# Patient Record
Sex: Male | Born: 2000 | Race: White | Hispanic: No | Marital: Single | Smoking: Never smoker
Health system: Southern US, Community
[De-identification: ages and names within clinical notes are randomized; demographics above are authoritative.]

## PROBLEM LIST (undated history)

## (undated) HISTORY — PX: SHOULDER SURGERY: SHX246

---
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
## 2018-09-10 IMAGING — CR DG CHEST 2V
1 series · 2 of 2 positions shown · non-contrast
Comparison: None.

CLINICAL DATA: Fever last night.

EXAM:
CHEST  2 VIEW

[Series 1: dg chest 2 view · 0.14mm/px · 2 of 2 slices shown]
[im 1/2]
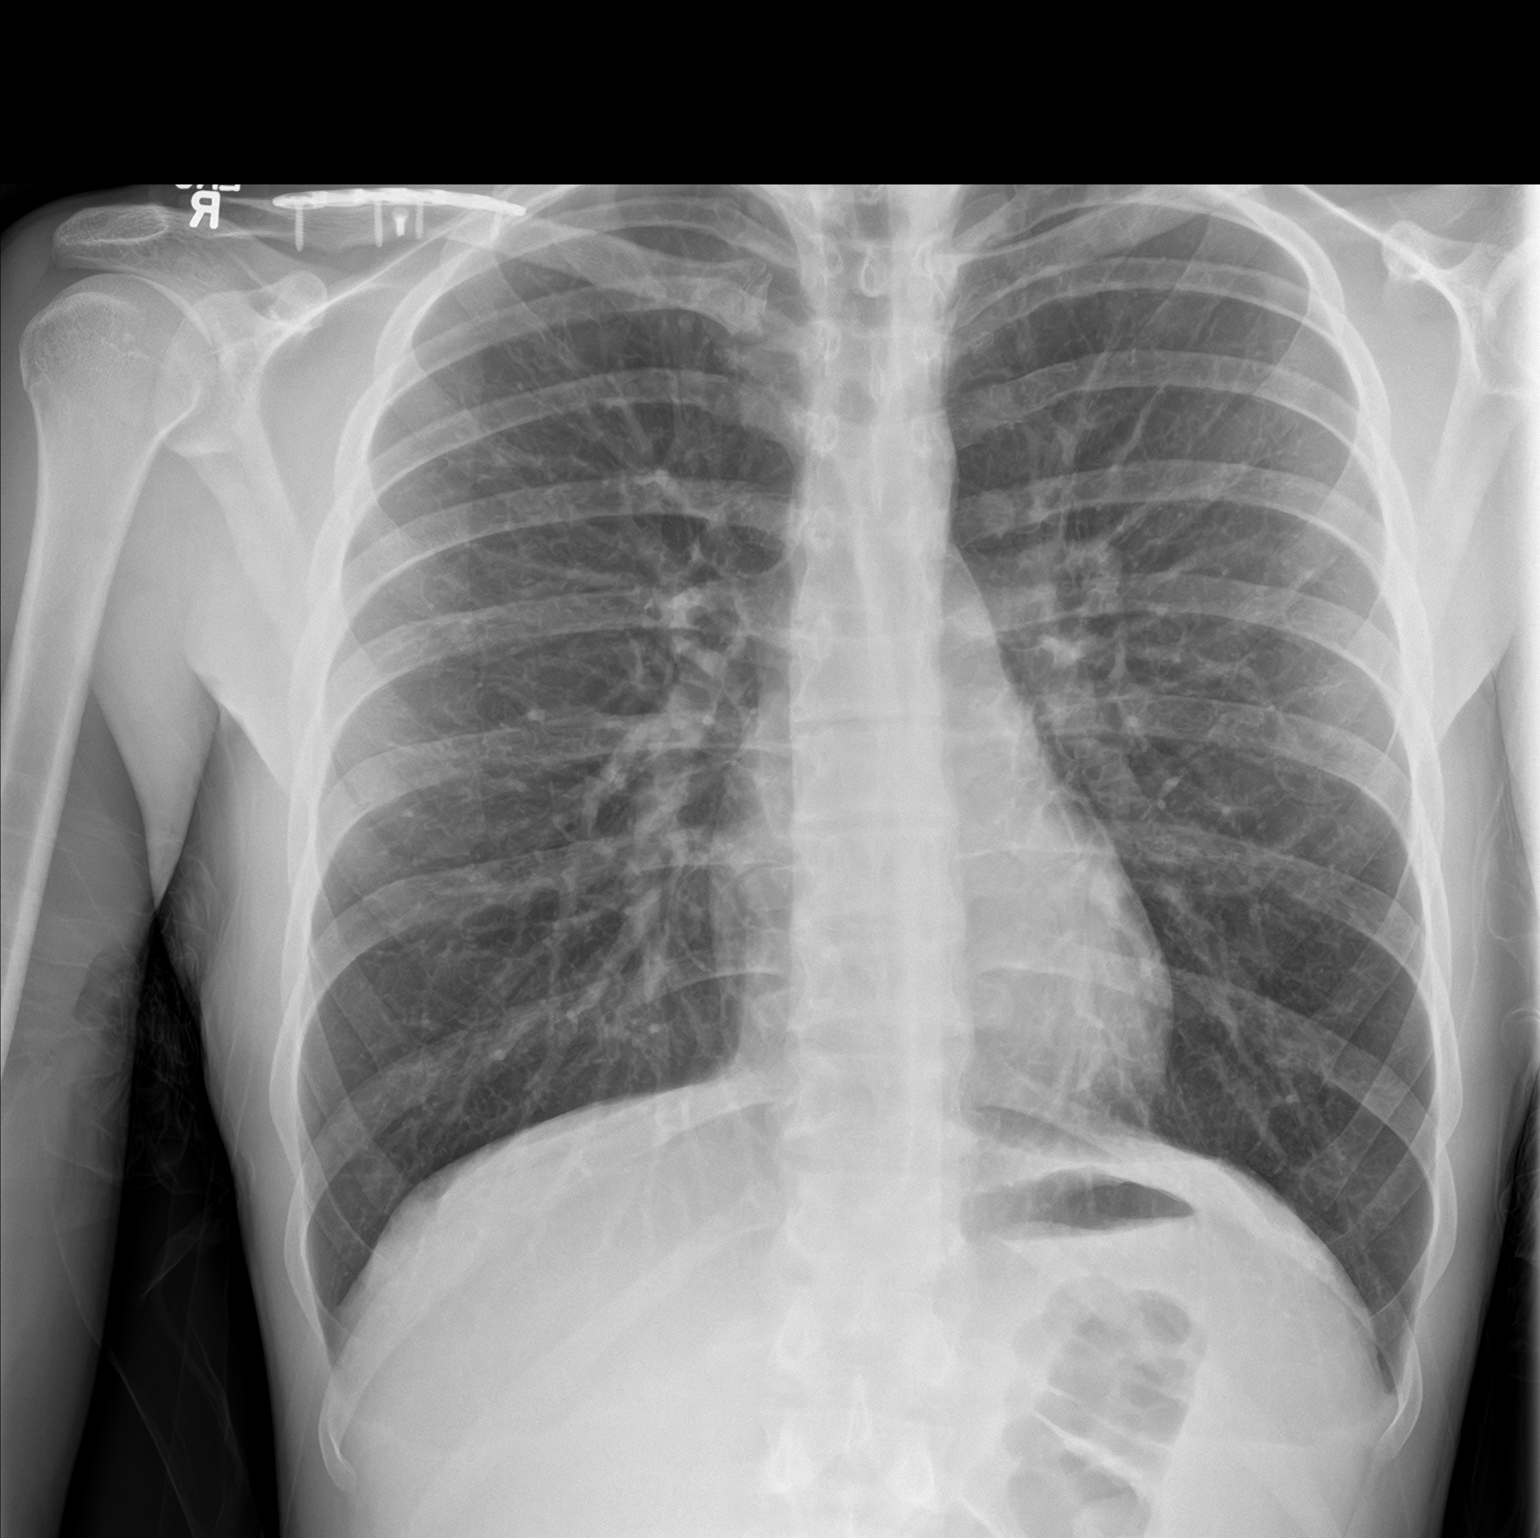
[im 2/2]
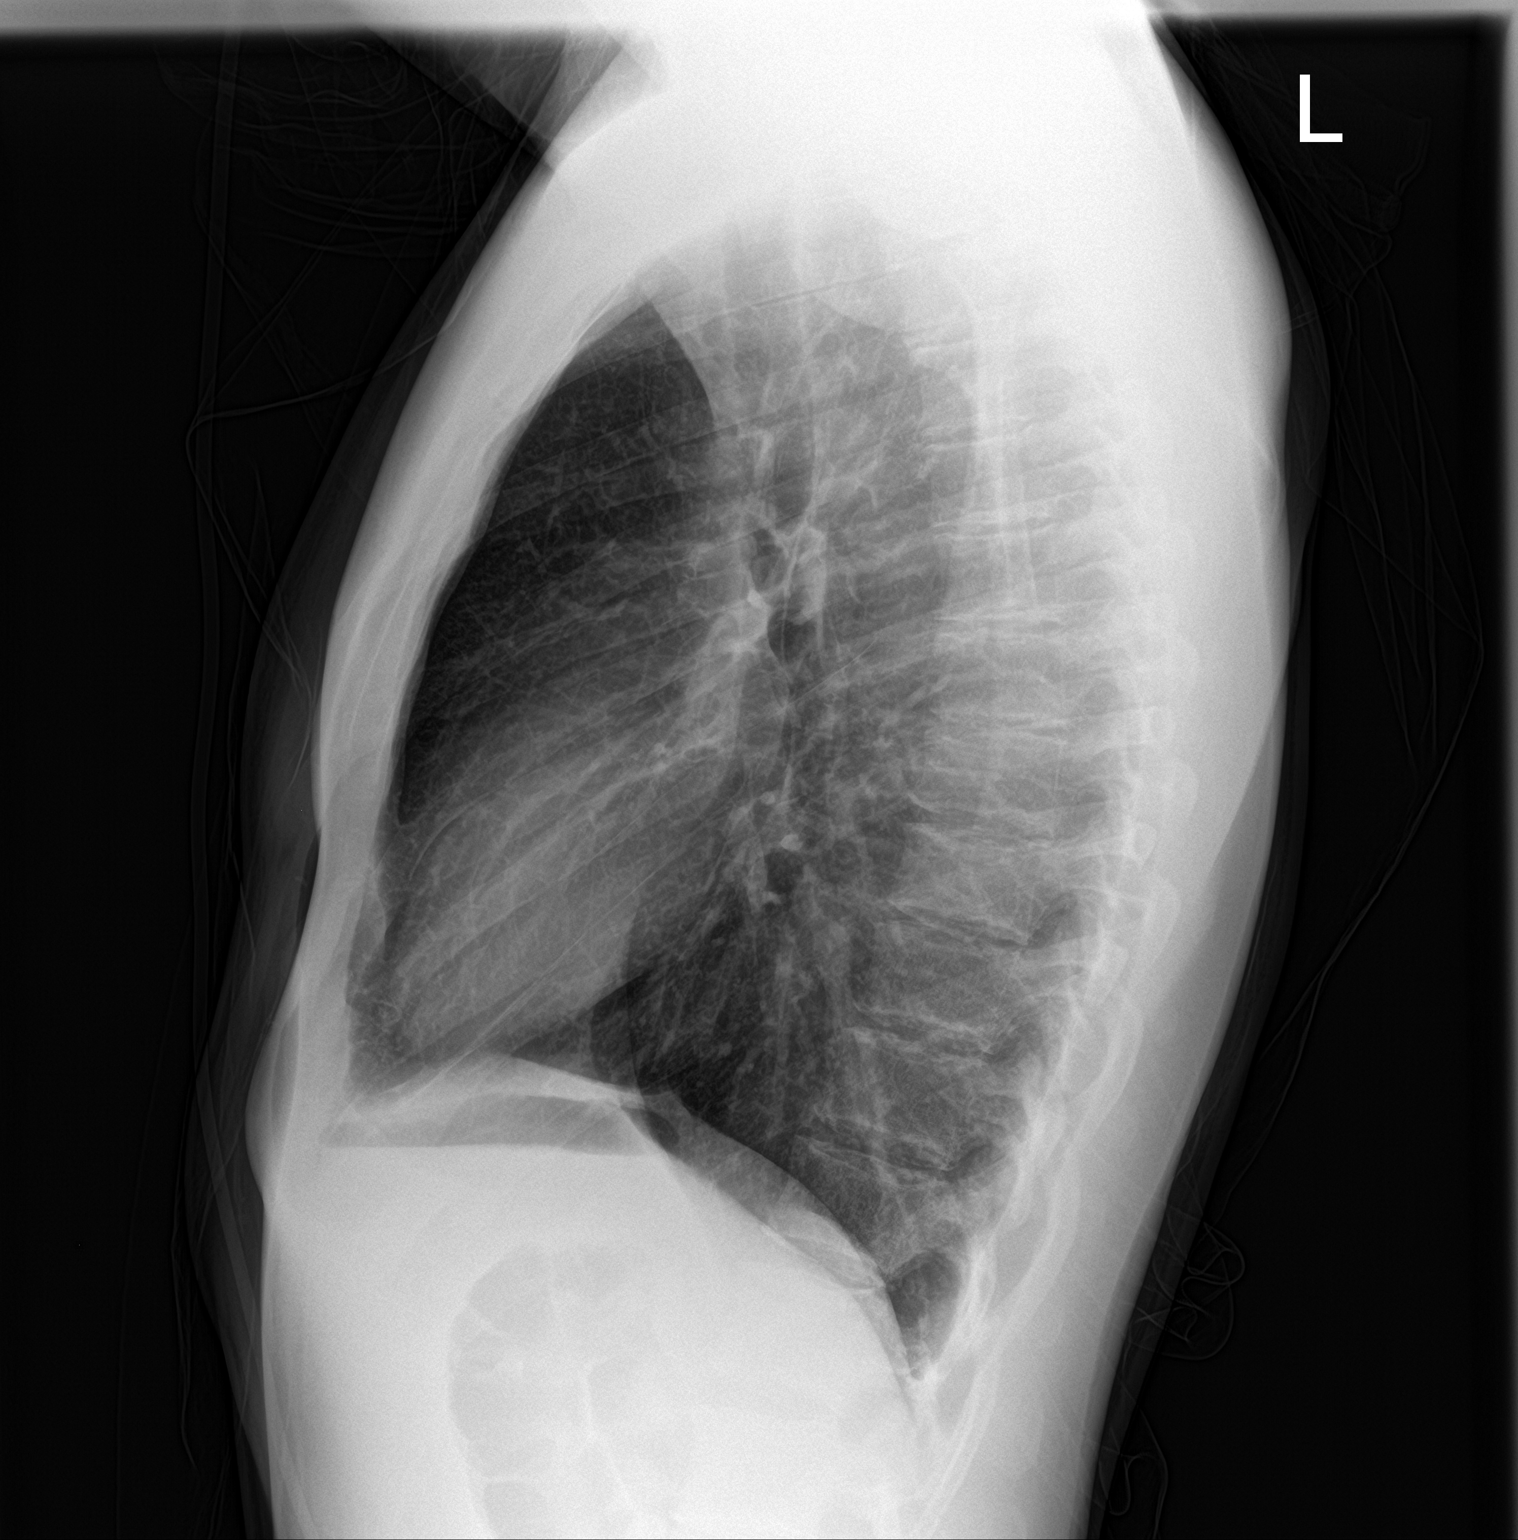

[2 of 2 positions shown; findings below may reference images not displayed]

FINDINGS: The cardiomediastinal contours are normal. Mild bronchial
thickening. Pulmonary vasculature is normal. No consolidation,
pleural effusion, or pneumothorax. No acute osseous abnormalities
are seen. Plate and screw fixation of the right clavicle.
IMPRESSION: Mild bronchial thickening.  No pneumonia.

## 2019-05-04 ENCOUNTER — Emergency Department
Admission: EM | Admit: 2019-05-04 | Discharge: 2019-05-04 | Disposition: A | Discharge status: Home / Self Care | Payer: Managed Care, Other (non HMO) | Attending: Emergency Medicine | Admitting: Emergency Medicine

## 2019-05-04 ENCOUNTER — Other Ambulatory Visit: Payer: Self-pay

## 2019-05-04 DIAGNOSIS — L03113 Cellulitis of right upper limb: Secondary | ICD-10-CM | POA: Insufficient documentation

## 2019-05-04 DIAGNOSIS — R001 Bradycardia, unspecified: Secondary | ICD-10-CM | POA: Diagnosis not present

## 2019-05-04 DIAGNOSIS — Y999 Unspecified external cause status: Secondary | ICD-10-CM | POA: Diagnosis not present

## 2019-05-04 DIAGNOSIS — W57XXXA Bitten or stung by nonvenomous insect and other nonvenomous arthropods, initial encounter: Secondary | ICD-10-CM | POA: Diagnosis not present

## 2019-05-04 DIAGNOSIS — Y9389 Activity, other specified: Secondary | ICD-10-CM | POA: Diagnosis not present

## 2019-05-04 DIAGNOSIS — Y9289 Other specified places as the place of occurrence of the external cause: Secondary | ICD-10-CM | POA: Insufficient documentation

## 2019-05-04 DIAGNOSIS — S40861A Insect bite (nonvenomous) of right upper arm, initial encounter: Secondary | ICD-10-CM | POA: Diagnosis present

## 2019-05-04 MED ORDER — MUPIROCIN CALCIUM 2 % EX CREA: TOPICAL_CREAM | CUTANEOUS | 0 refills | Status: AC

## 2019-05-04 NOTE — ED Notes (Signed)
Pt with bug bite to right upper arm. Penny sized bite mark with redness extending 6 cm around bite. Pt did not see bug that bit him. Pt states it is itchy

## 2019-05-04 NOTE — Discharge Instructions (Addendum)
Happy Early Rudene Anda from [facility]! Apply Bactroban twice daily for 7 days.

## 2019-05-04 NOTE — ED Triage Notes (Addendum)
Reports noticed rash today from bug bite yesterday.  Reports history of lymes disease.

## 2019-05-04 NOTE — ED Provider Notes (Signed)
[facility] Emergency Department Provider Note  ____________________________________________  Time seen: Approximately 8:53 PM  I have reviewed the triage vital signs and the nursing notes.   HISTORY  Chief Complaint Rash    HPI Cesar Mcdonald is a 18 y.o. male presents to the emergency department with an insect bite/sting that occurred yesterday after patient was sitting outside.  Patient noticed surrounding erythema this afternoon and became concerned.  He has been scratching at affected area.  Patient denies knowledge of tick bite but states that he has a history of Lyme disease and wanted to make sure he did not need to be treated with doxycycline again.  He has been afebrile at home.  No other alleviating measures have been attempted.        No past medical history on file.  There are no active problems to display for this patient.   Past Surgical History:  Procedure Laterality Date  . SHOULDER SURGERY      Prior to Admission medications   Medication Sig Start Date End Date Taking? Authorizing Provider  mupirocin cream (BACTROBAN) 2 % Apply to affected area 3 times daily 05/04/19 05/03/20  Lannie Fields, PA-C  predniSONE (DELTASONE) 50 MG tablet Take one 50 mg tablet once daily for the next 5 days. 11/29/17   Lannie Fields, PA-C    Allergies Shellfish allergy  No family history on file.  Social History Social History   Tobacco Use  . Smoking status: Never Smoker  . Smokeless tobacco: Never Used  Substance Use Topics  . Alcohol use: Yes  . Drug use: No     Review of Systems  Constitutional: No fever/chills Eyes: No visual changes. No discharge ENT: No upper respiratory complaints. Cardiovascular: no chest pain. Respiratory: no cough. No SOB. Gastrointestinal: No abdominal pain.  No nausea, no vomiting.  No diarrhea.  No constipation. Genitourinary: Negative for dysuria. No hematuria Musculoskeletal: Negative for musculoskeletal  pain. Skin: Patient has rash. Neurological: Negative for headaches, focal weakness or numbness.   ____________________________________________   PHYSICAL EXAM:  VITAL SIGNS: ED Triage Vitals  Enc Vitals Group     BP 05/04/19 1953 (!) 144/62     Pulse Rate 05/04/19 1953 (!) 44     Resp 05/04/19 1953 18     Temp 05/04/19 1953 97.7 F (36.5 C)     Temp Source 05/04/19 1953 Oral     SpO2 05/04/19 1953 97 %     Weight 05/04/19 1952 180 lb (81.6 kg)     Height 05/04/19 1952 6' (1.829 m)     Head Circumference --      Peak Flow --      Pain Score 05/04/19 1951 0     Pain Loc --      Pain Edu? --      Excl. in [place]? --      Constitutional: Alert and oriented. Well appearing and in no acute distress. Eyes: Conjunctivae are normal. PERRL. EOMI. Head: Atraumatic. Cardiovascular: Normal rate, regular rhythm. Normal S1 and S2.  Good peripheral circulation. Respiratory: Normal respiratory effort without tachypnea or retractions. Lungs CTAB. Good air entry to the bases with no decreased or absent breath sounds. Musculoskeletal: Full range of motion to all extremities. No gross deformities appreciated. Neurologic:  Normal speech and language. No gross focal neurologic deficits are appreciated.  Skin: Patient has insect bite/sting along right upper arm with approximately 2 cm of surrounding erythema. Psychiatric: Mood and affect are normal. Speech and  behavior are normal. Patient exhibits appropriate insight and judgement.   ____________________________________________   LABS (all labs ordered are listed, but only abnormal results are displayed)  Labs Reviewed - No data to display ____________________________________________  EKG   ____________________________________________  RADIOLOGY  No results found.  ____________________________________________    PROCEDURES  Procedure(s) performed:    Procedures    Medications - No data to  display   ____________________________________________   INITIAL IMPRESSION / ASSESSMENT AND PLAN / ED COURSE  Pertinent labs & imaging results that were available during my care of the patient were reviewed by me and considered in my medical decision making (see chart for details).  Review of the [place] CSRS was performed in accordance of the [facility] prior to dispensing any controlled drugs.         Assessment and plan 18 year old male presents to the emergency department after an insect bite/sting occurred yesterday.  Patient was bradycardic in triage but vital signs were otherwise reassuring.  Patient had an insect bite/sting visualized along right upper arm with 2 cm of surrounding cellulitis.  No palpable induration or fluctuance that would suggest abscess.  Patient was treated with Bactroban.  Return precautions were given.  All patient questions were answered.    ____________________________________________  FINAL CLINICAL IMPRESSION(S) / ED DIAGNOSES  Final diagnoses:  Insect bites and stings      NEW MEDICATIONS STARTED DURING THIS VISIT:  ED Discharge Orders         Ordered    mupirocin cream (BACTROBAN) 2 %     05/04/19 2050              This chart was dictated using voice recognition software/Dragon. Despite best efforts to proofread, errors can occur which can change the meaning. Any change was purely unintentional.    Gasper LloydWoods, Jaclyn M, PA-C 05/04/19 2055    Shaune PollackIsaacs, Cameron, MD 05/06/19 [phone]

## 2019-07-04 ENCOUNTER — Emergency Department
Admission: EM | Admit: 2019-07-04 | Discharge: 2019-07-04 | Disposition: A | Discharge status: Home / Self Care | Payer: Managed Care, Other (non HMO) | Attending: Emergency Medicine | Admitting: Emergency Medicine

## 2019-07-04 ENCOUNTER — Encounter: Payer: Self-pay | Admitting: Emergency Medicine

## 2019-07-04 ENCOUNTER — Other Ambulatory Visit: Payer: Self-pay

## 2019-07-04 DIAGNOSIS — J029 Acute pharyngitis, unspecified: Secondary | ICD-10-CM | POA: Diagnosis not present

## 2019-07-04 DIAGNOSIS — R21 Rash and other nonspecific skin eruption: Secondary | ICD-10-CM

## 2019-07-04 LAB — CBC WITH DIFFERENTIAL/PLATELET
Abs Immature Granulocytes: 0.05 10*3/uL (ref 0.00–0.07)
Basophils Absolute: 0.1 10*3/uL (ref 0.0–0.1)
Basophils Relative: 0 %
Eosinophils Absolute: 0.5 10*3/uL (ref 0.0–0.5)
Eosinophils Relative: 3 %
HCT: 45.2 % (ref 39.0–52.0)
Hemoglobin: 15.3 g/dL (ref 13.0–17.0)
Immature Granulocytes: 0 %
Lymphocytes Relative: 7 %
Lymphs Abs: 1.1 10*3/uL (ref 0.7–4.0)
MCH: 29.7 pg (ref 26.0–34.0)
MCHC: 33.8 g/dL (ref 30.0–36.0)
MCV: 87.6 fL (ref 80.0–100.0)
Monocytes Absolute: 1.6 10*3/uL — ABNORMAL HIGH (ref 0.1–1.0)
Monocytes Relative: 10 %
Neutro Abs: 12.9 10*3/uL — ABNORMAL HIGH (ref 1.7–7.7)
Neutrophils Relative %: 80 %
Platelets: 174 10*3/uL (ref 150–400)
RBC: 5.16 MIL/uL (ref 4.22–5.81)
RDW: 12.3 % (ref 11.5–15.5)
WBC: 16.3 10*3/uL — ABNORMAL HIGH (ref 4.0–10.5)
nRBC: 0 % (ref 0.0–0.2)

## 2019-07-04 LAB — COMPREHENSIVE METABOLIC PANEL WITH GFR
ALT: 25 U/L (ref 0–44)
AST: 21 U/L (ref 15–41)
Albumin: 4.4 g/dL (ref 3.5–5.0)
Alkaline Phosphatase: 70 U/L (ref 38–126)
Anion gap: 9 (ref 5–15)
BUN: 9 mg/dL (ref 6–20)
CO2: 27 mmol/L (ref 22–32)
Calcium: 9.2 mg/dL (ref 8.9–10.3)
Chloride: 101 mmol/L (ref 98–111)
Creatinine, Ser: 0.92 mg/dL (ref 0.61–1.24)
GFR calc Af Amer: >60 mL/min
GFR calc non Af Amer: >60 mL/min
Glucose, Bld: 126 mg/dL — ABNORMAL HIGH (ref 70–99)
Potassium: 4.1 mmol/L (ref 3.5–5.1)
Sodium: 137 mmol/L (ref 135–145)
Total Bilirubin: 1.3 mg/dL — ABNORMAL HIGH (ref 0.3–1.2)
Total Protein: 7.7 g/dL (ref 6.5–8.1)

## 2019-07-04 LAB — CBC
HCT: 44.7 % (ref 39.0–52.0)
Hemoglobin: 15.4 g/dL (ref 13.0–17.0)
MCH: 29.9 pg (ref 26.0–34.0)
MCHC: 34.5 g/dL (ref 30.0–36.0)
MCV: 86.8 fL (ref 80.0–100.0)
Platelets: 159 10*3/uL (ref 150–400)
RBC: 5.15 MIL/uL (ref 4.22–5.81)
RDW: 12.1 % (ref 11.5–15.5)
WBC: 16 10*3/uL — ABNORMAL HIGH (ref 4.0–10.5)
nRBC: 0 % (ref 0.0–0.2)

## 2019-07-04 LAB — GROUP A STREP BY PCR: Group A Strep by PCR: NOT DETECTED

## 2019-07-04 LAB — MONONUCLEOSIS SCREEN: Mono Screen: NEGATIVE

## 2019-07-04 MED ORDER — FLUTICASONE PROPIONATE 50 MCG/ACT NA SUSP: 2.0000 | Freq: Every day | NASAL | 0 refills | Status: AC

## 2019-07-04 MED ORDER — AMOXICILLIN 500 MG PO CAPS: 500.0000 mg | ORAL_CAPSULE | Freq: Three times a day (TID) | ORAL | 0 refills | Status: AC

## 2019-07-04 MED ORDER — ACETAMINOPHEN 500 MG PO TABS
1000.0000 mg | ORAL_TABLET | Freq: Once | ORAL | Status: AC
  Administered 2019-07-04: 1000 mg via ORAL
  Filled 2019-07-04: qty 2

## 2019-07-04 MED ORDER — AMOXICILLIN 500 MG PO CAPS
500.0000 mg | ORAL_CAPSULE | Freq: Once | ORAL | Status: AC
  Administered 2019-07-04: 500 mg via ORAL
  Filled 2019-07-04: qty 1

## 2019-07-04 NOTE — ED Notes (Signed)
Pt to the desk at this time, states "hey do you know when I'll go back I've been here for like an hour?" Pt noted to have water bottle in his hand, pt previously noted to have been eating. Ambulatory around the lobby without difficulty at this time.

## 2019-07-04 NOTE — Discharge Instructions (Addendum)
Take the antibiotic as directed. Drink plenty of fluids and continue to take OTC Tylenol and Motrin. Follow-up with [facility] or a local urgent care center, as needed. Return for worsening sore throat pain or swelling.

## 2019-07-04 NOTE — ED Notes (Signed)
Lab called to add on mono. States they need a red tube. Will send soon.

## 2019-07-04 NOTE — ED Triage Notes (Signed)
Pt reports sore throat and swelling and a rash all over his body. Pt reports this has been going on for a couple of days and he now has difficulty swallowing.

## 2019-07-04 NOTE — ED Notes (Signed)
Pt c/o stuffy nose, sore throat, swollen tonsils and hives- pt states that he has had pharyngitis last year and that it feels very similar to his

## 2019-07-04 NOTE — ED Provider Notes (Signed)
[facility] Emergency Department Provider Note ____________________________________________  Time seen: 1406  I have reviewed the triage vital signs and the nursing notes.  HISTORY  Chief Complaint  Sore Throat and Rash  HPI Cesar Mcdonald is a 18 y.o. male presents himself to the ED for evaluation of sore throat and fullness.  Patient also describes a generalized rash to his body.  He describes onset of symptoms for the last couple days, and now reports some increased pain and difficulty with swallowing.   He has been taking ibuprofen and Tylenol in the interim with minimal benefit.  He does not endorse any difficulty controlling his secretions, swallowing, or breathing.  Patient reports a similar episode about a year prior, that resolved after antibiotic treatment.  He apparently was seen at a local urgent care and reports a negative rapid strep test yesterday.  History reviewed. No pertinent past medical history.  There are no active problems to display for this patient.   Past Surgical History:  Procedure Laterality Date  . SHOULDER SURGERY      Prior to Admission medications   Medication Sig Start Date End Date Taking? Authorizing Provider  amoxicillin (AMOXIL) 500 MG capsule Take 1 capsule (500 mg total) by mouth 3 (three) times daily. 07/04/19   Clevland Cork, Charlesetta Ivory, PA-C  fluticasone (FLONASE) 50 MCG/ACT nasal spray Place 2 sprays into both nostrils daily. 07/04/19   Sherrill Buikema, Charlesetta Ivory, PA-C  mupirocin cream (BACTROBAN) 2 % Apply to affected area 3 times daily 05/04/19 05/03/20  Orvil Feil, PA-C  predniSONE (DELTASONE) 50 MG tablet Take one 50 mg tablet once daily for the next 5 days. 11/29/17   Orvil Feil, PA-C    Allergies Peanut-containing drug products  No family history on file.  Social History Social History   Tobacco Use  . Smoking status: Never Smoker  . Smokeless tobacco: Never Used  Substance Use Topics  . Alcohol use:  Yes  . Drug use: No    Review of Systems  Constitutional: Positive for fever. Eyes: Negative for visual changes. ENT: Positive for sore throat. Cardiovascular: Negative for chest pain. Respiratory: Negative for shortness of breath. Gastrointestinal: Negative for abdominal pain, vomiting and diarrhea. Genitourinary: Negative for dysuria. Musculoskeletal: Negative for back pain. Skin: Negative for rash. Neurological: Negative for headaches, focal weakness or numbness. ____________________________________________  PHYSICAL EXAM:  VITAL SIGNS: ED Triage Vitals  Enc Vitals Group     BP 07/04/19 1039 (!) 107/93     Pulse Rate 07/04/19 1039 (!) 102     Resp 07/04/19 1039 16     Temp 07/04/19 1039 99 F (37.2 C)     Temp Source 07/04/19 1039 Oral     SpO2 07/04/19 1039 98 %     Weight 07/04/19 1039 175 lb (79.4 kg)     Height 07/04/19 1039 6' (1.829 m)     Head Circumference --      Peak Flow --      Pain Score 07/04/19 1044 9     Pain Loc --      Pain Edu? --      Excl. in GC? --     Constitutional: Alert and oriented. Well appearing and in no distress. Head: Normocephalic and atraumatic. Eyes: Conjunctivae are normal. PERRL. Normal extraocular movements Ears: Canals clear. TMs intact bilaterally. Nose: No congestion/rhinorrhea/epistaxis. Mouth/Throat: Mucous membranes are moist.  Uvula is midline tonsils are enlarged, and exudates are noted bilaterally. Neck: Supple. No thyromegaly. Hematological/Lymphatic/Immunological:  Palpable anterior cervical  lymphadenopathy. Cardiovascular: Normal rate, regular rhythm. Normal distal pulses. Respiratory: Normal respiratory effort. No wheezes/rales/rhonchi. Gastrointestinal: Soft and nontender. No distention.  Normal bowel sounds noted.  No organomegaly, rebound, guarding, or rigidity appreciated. Musculoskeletal: Nontender with normal range of motion in all extremities.  Neurologic:  Normal gait without ataxia. Normal speech and  language. No gross focal neurologic deficits are appreciated. Skin:  Skin is warm, dry and intact.  Patient with generalized maculopapular rash noted from the neck, trunk, and extremities, including the palms.  Psychiatric: Mood and affect are normal. Patient exhibits appropriate insight and judgment. ____________________________________________   LABS (pertinent positives/negatives) Labs Reviewed  CBC - Abnormal; Notable for the following components:      Result Value   WBC 16.0 (*)    All other components within normal limits  COMPREHENSIVE METABOLIC PANEL - Abnormal; Notable for the following components:   Glucose, Bld 126 (*)    Total Bilirubin 1.3 (*)    All other components within normal limits  CBC WITH DIFFERENTIAL/PLATELET - Abnormal; Notable for the following components:   WBC 16.3 (*)    Neutro Abs 12.9 (*)    Monocytes Absolute 1.6 (*)    All other components within normal limits  GROUP A STREP BY PCR  CULTURE, GROUP A STREP ([place])  MONONUCLEOSIS SCREEN  ____________________________________________  PROCEDURES  Tylenol 1000 mg PO Amoxil 500 mg PO Procedures ____________________________________________  INITIAL IMPRESSION / ASSESSMENT AND PLAN / ED COURSE  Patient with ED evaluation of sore throat, fevers, and generalized rash.  Patient was evaluated for possible x-rays etiology, but reports a recent negative rapid strep test.  Today he is found to be slightly febrile, tachycardic, but otherwise without any signs of acute respiratory distress, sepsis, or toxic appearance.  His Monospot test is negative but he does have a elevated white count with a left shift.  He also has a mild monocytosis.  A throat culture is pending at the time of discharge.  Rapid strep test is again negative at this presentation.  Patient is encouraged to follow-up with a primary provider return to the ED for worsening symptoms as discussed.  A prescription for amoxicillin is provided and he  verbalizes understanding of discharge instructions.  Cesar Mcdonald was evaluated in Emergency Department on 07/04/2019 for the symptoms described in the history of present illness. He was evaluated in the context of the global COVID-19 pandemic, which necessitated consideration that the patient might be at risk for infection with the SARS-CoV-2 virus that causes COVID-19. Institutional protocols and algorithms that pertain to the evaluation of patients at risk for COVID-19 are in a state of rapid change based on information released by regulatory bodies including the CDC and federal and state organizations. These policies and algorithms were followed during the patient's care in the ED. ____________________________________________  FINAL CLINICAL IMPRESSION(S) / ED DIAGNOSES  Final diagnoses:  Sore throat  Pharyngitis, unspecified etiology  Rash and nonspecific skin eruption      Carmie End, Dannielle Karvonen, PA-C 07/04/19 1853    Delman Kitten, MD 07/04/19 1947

## 2019-07-07 ENCOUNTER — Other Ambulatory Visit: Payer: Self-pay

## 2019-07-07 ENCOUNTER — Emergency Department
Admission: EM | Admit: 2019-07-07 | Discharge: 2019-07-07 | Disposition: A | Discharge status: Home / Self Care | Payer: Managed Care, Other (non HMO) | Attending: Emergency Medicine | Admitting: Emergency Medicine

## 2019-07-07 ENCOUNTER — Encounter: Payer: Self-pay | Admitting: Emergency Medicine

## 2019-07-07 DIAGNOSIS — T360X5A Adverse effect of penicillins, initial encounter: Secondary | ICD-10-CM | POA: Diagnosis not present

## 2019-07-07 DIAGNOSIS — Z91010 Allergy to peanuts: Secondary | ICD-10-CM | POA: Diagnosis not present

## 2019-07-07 DIAGNOSIS — Z79899 Other long term (current) drug therapy: Secondary | ICD-10-CM | POA: Diagnosis not present

## 2019-07-07 DIAGNOSIS — B09 Unspecified viral infection characterized by skin and mucous membrane lesions: Secondary | ICD-10-CM | POA: Diagnosis not present

## 2019-07-07 DIAGNOSIS — Z20828 Contact with and (suspected) exposure to other viral communicable diseases: Secondary | ICD-10-CM | POA: Diagnosis not present

## 2019-07-07 DIAGNOSIS — R21 Rash and other nonspecific skin eruption: Secondary | ICD-10-CM

## 2019-07-07 LAB — SARS CORONAVIRUS 2 (TAT 6-24 HRS): SARS Coronavirus 2: NEGATIVE

## 2019-07-07 MED ORDER — DEXAMETHASONE SODIUM PHOSPHATE 10 MG/ML IJ SOLN
10.0000 mg | Freq: Once | INTRAMUSCULAR | Status: AC
  Administered 2019-07-07: 10 mg via INTRAMUSCULAR
  Filled 2019-07-07: qty 1

## 2019-07-07 NOTE — ED Provider Notes (Signed)
[facility] Emergency Department Provider Note   ____________________________________________   First MD Initiated Contact with Patient 07/07/19 [phone]     (approximate)  I have reviewed the triage vital signs and the nursing notes.   HISTORY  Chief Complaint Rash    HPI Cesar Mcdonald is a 18 y.o. male who [facility] with a chief complaint of rash.  Began to experience sore throat and myalgias on Tuesday, 11/10. Went to urgent care on 11/12 with reported negative strep test. Noted rash to entire body on the morning of 11/13 associated with mild itchiness. Seen in ED on afternoon of 11/13 and placed on Amoxicillin for pharyngitis.  Reports sore throat has resolved but rash is worse.  Tested positive for COVID-19 in August.  Denies new exposures, foods or over-the-counter medications.  Has taken amoxicillin before without reaction.  Denies fever, headache, neck pain, cough, chest pain, shortness of breath, abdominal pain, nausea, vomiting, dysuria, urethral discharge.  Denies tongue, lip or facial swelling, sloughing of skin or mucous membranes.  Denies tick bite in the past 2 to 3 months.  No sexual intercourse in the last month.       Past medical history None  There are no active problems to display for this patient.   Past Surgical History:  Procedure Laterality Date  . SHOULDER SURGERY      Prior to Admission medications   Medication Sig Start Date End Date Taking? Authorizing Provider  amoxicillin (AMOXIL) 500 MG capsule Take 1 capsule (500 mg total) by mouth 3 (three) times daily. 07/04/19   Menshew, Charlesetta Ivory, PA-C  fluticasone (FLONASE) 50 MCG/ACT nasal spray Place 2 sprays into both nostrils daily. 07/04/19   Menshew, Charlesetta Ivory, PA-C  mupirocin cream (BACTROBAN) 2 % Apply to affected area 3 times daily 05/04/19 05/03/20  Orvil Feil, PA-C  predniSONE (DELTASONE) 50 MG tablet Take one 50 mg tablet once daily  for the next 5 days. 11/29/17   Orvil Feil, PA-C    Allergies Peanut-containing drug products  History reviewed. No pertinent family history.  Social History Social History   Tobacco Use  . Smoking status: Never Smoker  . Smokeless tobacco: Never Used  Substance Use Topics  . Alcohol use: Yes  . Drug use: No    Review of Systems  Constitutional: No fever/chills Eyes: No visual changes. ENT: Positive for sore throat. Cardiovascular: Denies chest pain. Respiratory: Denies shortness of breath. Gastrointestinal: No abdominal pain.  No nausea, no vomiting.  No diarrhea.  No constipation. Genitourinary: Negative for dysuria. Musculoskeletal: Negative for back pain. Skin: Positive for rash. Neurological: Negative for headaches, focal weakness or numbness.   ____________________________________________   PHYSICAL EXAM:  VITAL SIGNS: ED Triage Vitals  Enc Vitals Group     BP 07/07/19 0518 125/71     Pulse Rate 07/07/19 0518 65     Resp 07/07/19 0518 16     Temp 07/07/19 0518 98.5 F (36.9 C)     Temp Source 07/07/19 0518 Oral     SpO2 07/07/19 0518 100 %     Weight 07/07/19 0517 174 lb 13.2 oz (79.3 kg)     Height 07/07/19 0517 6' (1.829 m)     Head Circumference --      Peak Flow --      Pain Score 07/07/19 0516 0     Pain Loc --      Pain Edu? --  Excl. in GC? --     Constitutional: Alert and oriented. Well appearing and in no acute distress. Eyes: Conjunctivae are normal. PERRL. EOMI. Head: Atraumatic. Nose: No congestion/rhinnorhea. Mouth/Throat: Mucous membranes are moist.  Oropharynx mildly erythematous without tonsillar swelling, exudates or peritonsillar abscess.  There is no hoarse or muffled voice.  There is no drooling.  There is no strawberry tongue.  There is no mucous membrane involvement. Neck: No stridor.  Supple neck without meningismus. Cardiovascular: Normal rate, regular rhythm. Grossly normal heart sounds.  Good peripheral  circulation. Respiratory: Normal respiratory effort.  No retractions. Lungs CTAB. Gastrointestinal: Soft and nontender. No distention. No abdominal bruits. No CVA tenderness. Genitourinary: Circumcised male.  No urethral discharge.  Bilateral distended testicles which are nontender and nonswollen.  No herpetic lesions or ulcers.  Strong bilateral cremasteric reflexes. Musculoskeletal: No lower extremity tenderness nor edema.  No joint effusions. Neurologic:  Normal speech and language. No gross focal neurologic deficits are appreciated. No gait instability. Skin:  Skin is warm, dry and intact.  Diffuse maculopapular rash noted from neck, upper extremity, trunk, buttocks and legs.  Patchy rash on palms of hands. No rash on soles of feet.  No petechiae.  No vesicles.  No Nikolsky sign. Psychiatric: Mood and affect are normal. Speech and behavior are normal.  ____________________________________________   LABS (all labs ordered are listed, but only abnormal results are displayed)  Labs Reviewed  SARS CORONAVIRUS 2 (TAT 6-24 HRS)   ____________________________________________  EKG  None ____________________________________________  RADIOLOGY  ED MD interpretation: None  Official radiology report(s): No results found.  ____________________________________________   PROCEDURES  Procedure(s) performed (including Critical Care):  Procedures   ____________________________________________   INITIAL IMPRESSION / ASSESSMENT AND PLAN / ED COURSE  As part of my medical decision making, I reviewed the following data within the electronic MEDICAL RECORD NUMBER Nursing notes reviewed and incorporated, Old chart reviewed and Notes from prior ED visits     Cesar Mcdonald was evaluated in Emergency Department on 07/07/2019 for the symptoms described in the history of present illness. He was evaluated in the context of the global COVID-19 pandemic, which necessitated consideration that the  patient might be at risk for infection with the SARS-CoV-2 virus that causes COVID-19. Institutional protocols and algorithms that pertain to the evaluation of patients at risk for COVID-19 are in a state of rapid change based on information released by regulatory bodies including the CDC and federal and state organizations. These policies and algorithms were followed during the patient's care in the ED.    18 year old male who presents with rash in the setting of recent sore throat and amoxicillin use.  Covid positive in August.  Differential diagnosis includes but is not limited to urticaria secondary to allergic reaction, Stevens-Johnson syndrome, hand-foot-and-mouth, Covid rash, viral exanthem, syphilis, etc.    I personally reviewed patient's chart and see that his strep culture is negative.  There is no mucous membrane involvement or skin desquamation to strongly suggest Stevens-Johnson's.  There is no strawberry tongue, chest pain, gastrointestinal, neurological signs to suggest post Covid multisystem inflammatory syndrome in adults.  Given no sexual intercourse in the past month, syphilis seems less likely.    Overall patient is clinically well-appearing and nontoxic.  Low suspicion of meningitis.  Advised patient to stop Amoxicillin as it likely is making the rash worse and most likely patient had viral pharyngitis.  Will give IM Decadron.  Refer to dermatology for outpatient follow-up and skin biopsy. Perform Covid-19 testing.  Asked patient to quarantine himself until results of Covid return. Strict return precautions given. Patient verbalizes understanding and agrees with plan of care.      ____________________________________________   FINAL CLINICAL IMPRESSION(S) / ED DIAGNOSES  Final diagnoses:  Rash  Viral exanthem     ED Discharge Orders    None       Note:  This document was prepared using Dragon voice recognition software and may include unintentional dictation errors.    Paulette Blanch, MD 07/07/19 [phone]

## 2019-07-07 NOTE — Discharge Instructions (Signed)
1.  Please quarantine yourself until the results of your COVID-19 test are back. 2.  You may take Benadryl as needed for itching. 3.  Please discontinue antibiotic. 4.  Return to the ER for worsening symptoms, persistent vomiting, difficulty breathing, peeling of your mouth or skin, or other concerns.

## 2019-07-07 NOTE — ED Triage Notes (Signed)
Pt was seen here Friday for sore throat and rash; pt was put on antibiotics and throat is feeling much better; pt says rash is no better if not worse; total body mottled appearing rash with some itching; pt has been using OTC Aveeno for itching; no breathing problems;
